# Patient Record
Sex: Male | Born: 1975 | Race: White | Hispanic: No | Marital: Married | Smoking: Never smoker
Health system: Southern US, Community
[De-identification: ages and names within clinical notes are randomized; demographics above are authoritative.]

## PROBLEM LIST (undated history)

## (undated) DIAGNOSIS — I10 Essential (primary) hypertension: Secondary | ICD-10-CM

## (undated) HISTORY — PX: TONSILLECTOMY: SUR1361

---

## 2005-07-11 ENCOUNTER — Ambulatory Visit: Payer: Self-pay | Admitting: *Deleted

## 2006-08-03 IMAGING — CR DG CHEST 2V
1 series · 2 of 2 positions shown · non-contrast
Comparison: none

REASON FOR EXAM: Wheezing
COMMENTS:

PROCEDURE:     DXR - DXR CHEST PA (OR AP) AND LATERAL  - July 11, 2005  [DATE]
RESULT:     Two views of the chest show the lungs are clear. The heart and
pulmonary vessels are normal. The bony and mediastinal structures are
unremarkable. There is no effusion or pneumothorax.

[Series 4001: postero_anterior · 0.22mm/px · 2 of 2 slices shown]
[im 1/2]
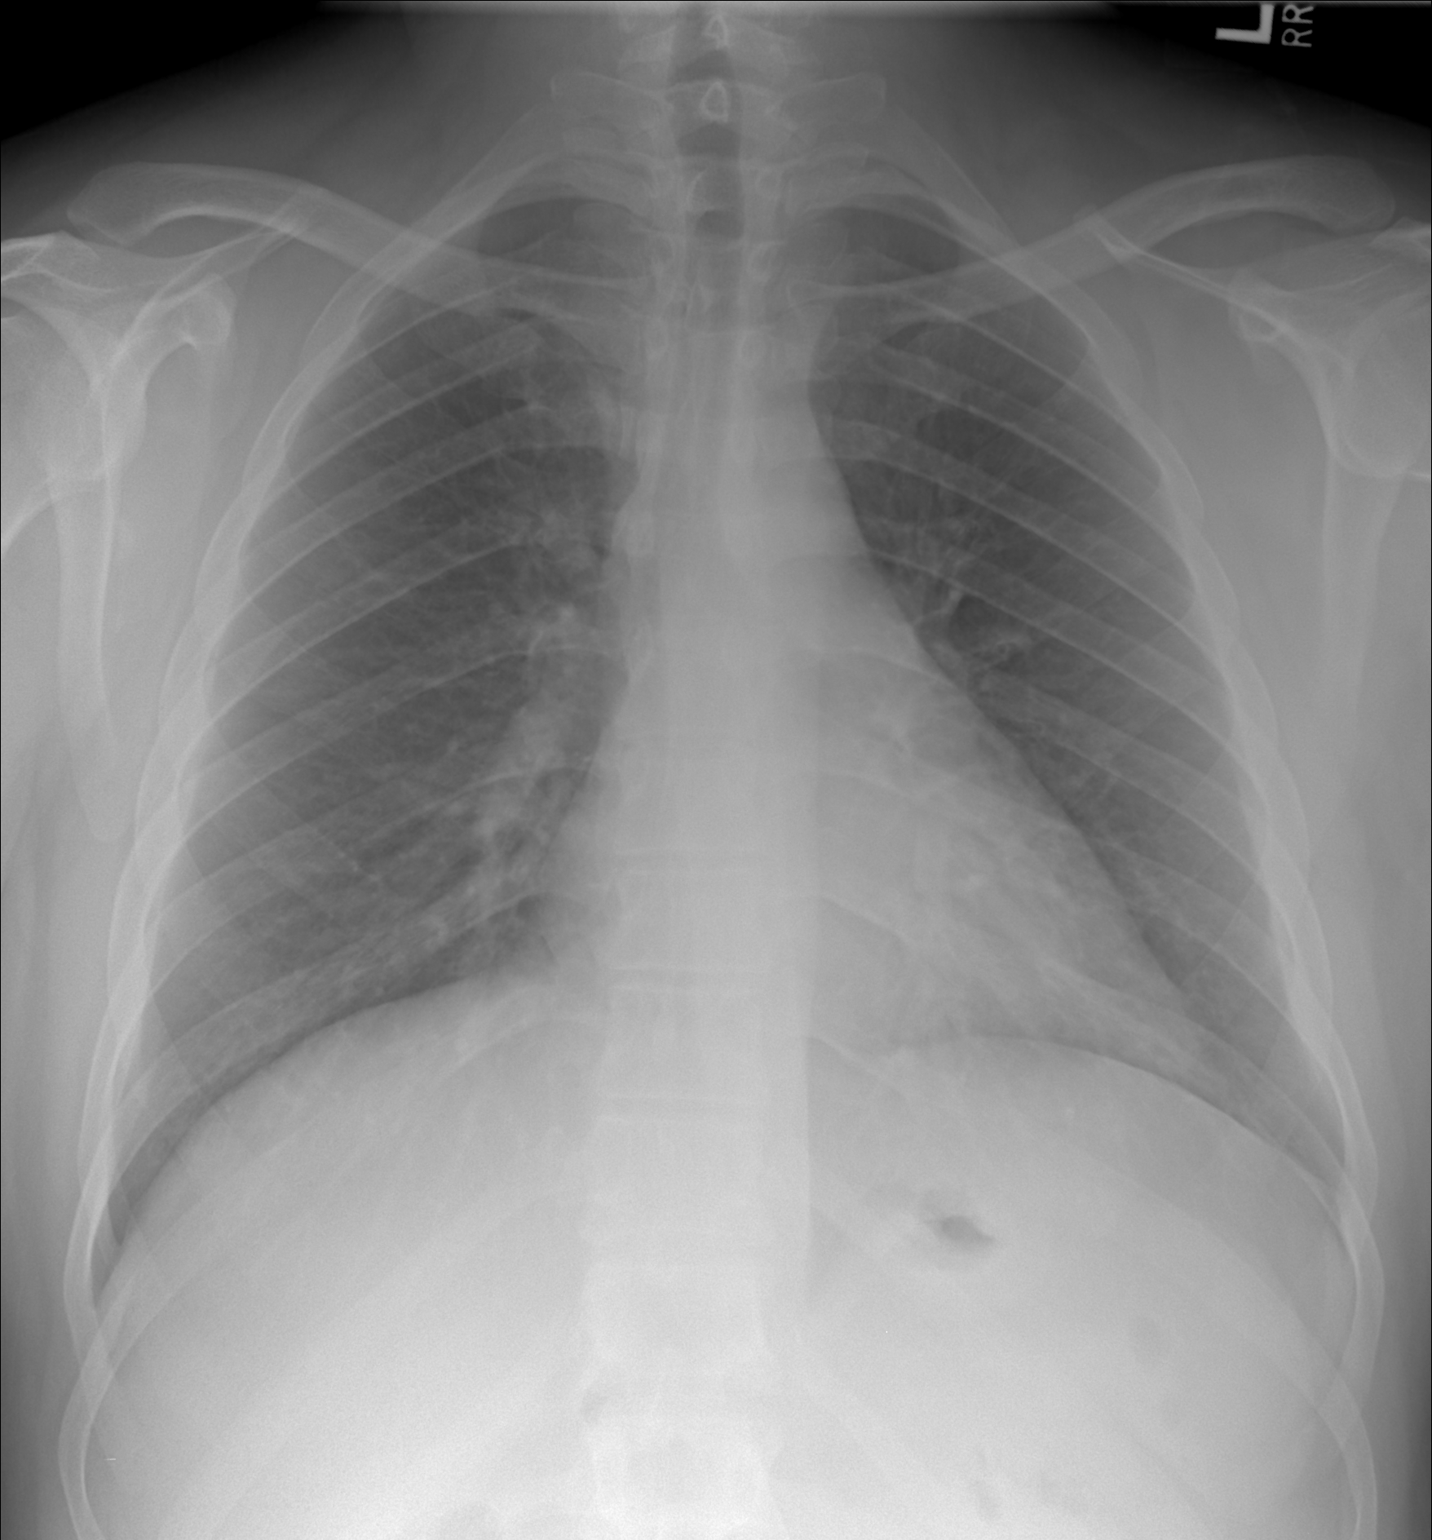
[im 2/2]
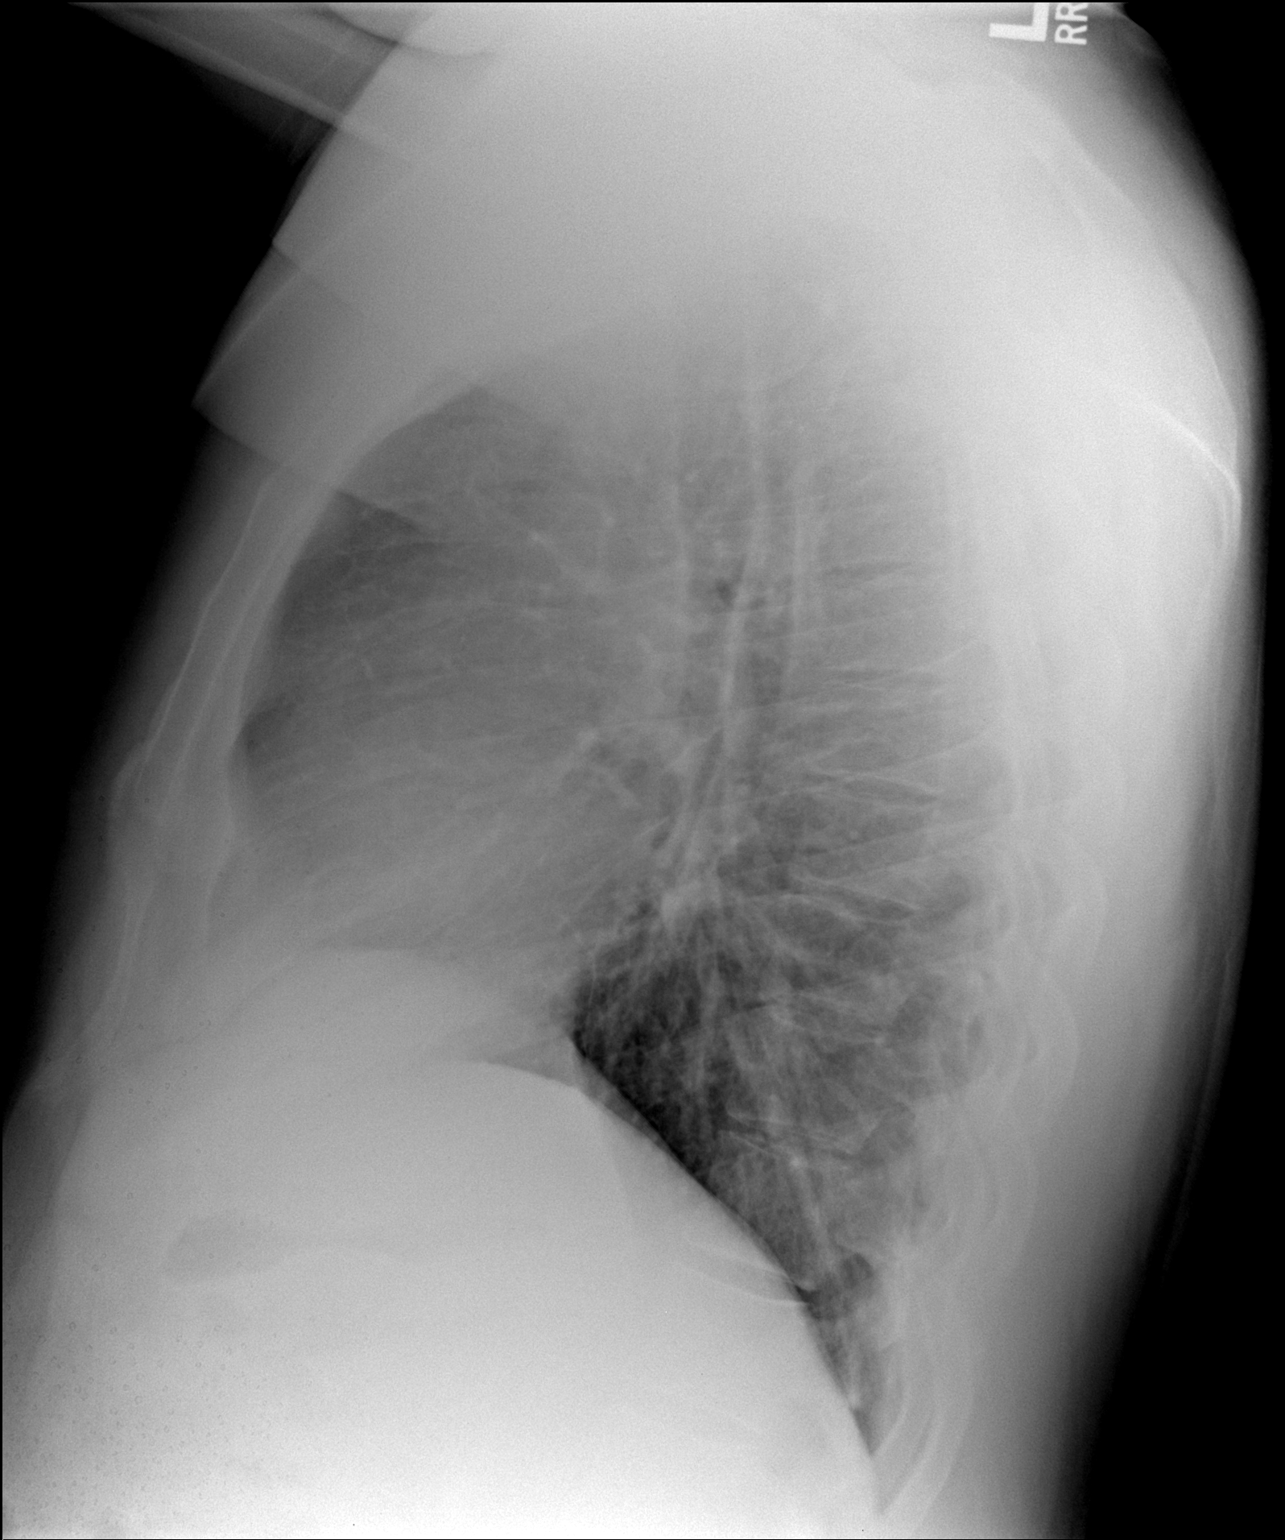

[2 of 2 positions shown; findings below may reference images not displayed]

IMPRESSION: No acute cardiopulmonary disease evident.

## 2013-12-24 ENCOUNTER — Ambulatory Visit: Payer: Self-pay | Admitting: Family Medicine

## 2017-03-15 ENCOUNTER — Ambulatory Visit
Admission: EM | Admit: 2017-03-15 | Discharge: 2017-03-15 | Disposition: A | Discharge status: Home / Self Care | Payer: 59 | Attending: Family Medicine | Admitting: Family Medicine

## 2017-03-15 ENCOUNTER — Encounter: Payer: Self-pay | Admitting: Gynecology

## 2017-03-15 DIAGNOSIS — B349 Viral infection, unspecified: Secondary | ICD-10-CM

## 2017-03-15 DIAGNOSIS — R509 Fever, unspecified: Secondary | ICD-10-CM

## 2017-03-15 DIAGNOSIS — M545 Low back pain, unspecified: Secondary | ICD-10-CM

## 2017-03-15 HISTORY — DX: Essential (primary) hypertension: I10

## 2017-03-15 MED ORDER — NAPROXEN 500 MG PO TABS: 500.0000 mg | ORAL_TABLET | Freq: Two times a day (BID) | ORAL | 0 refills | Status: AC

## 2017-03-15 MED ORDER — CYCLOBENZAPRINE HCL 10 MG PO TABS: 10.0000 mg | ORAL_TABLET | Freq: Two times a day (BID) | ORAL | 0 refills | Status: AC | PRN

## 2017-03-15 NOTE — ED Triage Notes (Signed)
Fever last night 103.1. No other symptoms

## 2017-03-15 NOTE — Discharge Instructions (Signed)
For Fever: -likely a viral illness, treatment is symptom management -can continue with Tylenol for fever -fluids -return to clinic if no improvement or symptoms worsen  For back pain -Naproxen: one tablet twice a day for pain. Do not take Advil while taking this medication. Will help with fever as well -Flexeril: one tablet twice a day as needed for pain. 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

## 2017-03-15 NOTE — ED Provider Notes (Signed)
[facility]    CSN: [id number] Arrival date & time: 03/15/17  1513     History   Chief Complaint Chief Complaint  Patient presents with  . Fever    HPI Evan Cochran is a 42 y.o. male.   Patient is a 41 year old male who presents with complaint of fever 3 days and lower back pain that started Monday. 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 Patient reports he is taking Tylenol for his fever. He reports temp of 103 last night. Reports his last Tylenol was about 7:30 this morning. Patient denies other symptoms, including cough, runny nose, ear issues, sore throat. He also denies any body aches except his lower back pain. He denies any sick contacts at work but does report a GI bug going around at work. He was initially taken Advil but a nurse advised his wife to try Tylenol instead which has been better for his fever.      Past Medical History:  Diagnosis Date  . Hypertension     There are no active problems to display for this patient.   History reviewed. No pertinent surgical history.     Home Medications    Prior to Admission medications   Medication Sig Start Date End Date Taking? Authorizing Provider  FLUoxetine (PROZAC) 10 MG tablet Take 10 mg by mouth daily.   Yes [provider]  lisinopril (PRINIVIL,ZESTRIL) 10 MG tablet Take 10 mg by mouth daily.   Yes [provider]  cyclobenzaprine (FLEXERIL) 10 MG tablet Take 1 tablet (10 mg total) by mouth 2 (two) times daily as needed for muscle spasms. 03/15/17   Candis Schatz, PA-C  naproxen (NAPROSYN) 500 MG tablet Take 1 tablet (500 mg total) by mouth 2 (two) times daily. 03/15/17   Candis Schatz, PA-C    Family History No family history on file.  Social History Social History  Substance Use Topics  . Smoking status: Never Smoker  . Smokeless tobacco: Never Used  . Alcohol use 0.6  oz/week    1 Cans of beer per week     Allergies   Patient has no known allergies.   Review of Systems Review of Systems  As noted above in history of present illness. Other systems reviewed and found to be negative.   Physical Exam Triage Vital Signs ED Triage Vitals  Enc Vitals Group     BP 03/15/17 1528 136/86     Pulse Rate 03/15/17 1528 96     Resp 03/15/17 1528 18     Temp 03/15/17 1528 99.2 F (37.3 C)     Temp Source 03/15/17 1528 Oral     SpO2 03/15/17 1528 97 %     Weight 03/15/17 1527 (!) 305 lb (138.3 kg)     Height 03/15/17 1527 6' (1.829 m)     Head Circumference --      Peak Flow --      Pain Score --      Pain Loc --      Pain Edu? --      Excl. in GC? --    No data found.   Updated Vital Signs BP 136/86 (BP Location: Left Arm)   Pulse 96   Temp 99.2 F (37.3 C) (Oral)   Resp 18   Ht 6' (1.829 m)   Wt (!) 305 lb (138.3 kg)   SpO2 97%  BMI 41.37 kg/m   Physical Exam  Constitutional: He is oriented to person, place, and time. He appears well-developed and well-nourished.  HENT:  Head: Normocephalic and atraumatic.  Eyes: Pupils are equal, round, and reactive to light. EOM are normal.  Neck: Normal range of motion. Neck supple.  Cardiovascular: Normal rate, regular rhythm and normal heart sounds.   No murmur heard. Pulmonary/Chest: Effort normal and breath sounds normal. No respiratory distress. He has no wheezes. He has no rales.  Abdominal: Soft.  Musculoskeletal: Normal range of motion. He exhibits tenderness (mild tenderness with palpation.).  No decreased range of motion with the back discomfort.  Neurological: He is alert and oriented to person, place, and time. No cranial nerve deficit.     UC Treatments / Results  Labs (all labs ordered are listed, but only abnormal results are displayed) Labs Reviewed - No data to display  EKG  EKG Interpretation None       Radiology No results found.  Procedures Procedures  (including critical care time)  Medications Ordered in UC Medications - No data to display   Initial Impression / Assessment and Plan / UC Course  I have reviewed the triage vital signs and the nursing notes.  Pertinent labs & imaging results that were available during my care of the patient were reviewed by me and considered in my medical decision making (see chart for details).    Patient currently with fever but no other symptoms, tachycardia, or other signs of infection. Low back pain.  Final Clinical Impressions(s) / UC Diagnoses   Final diagnoses:  Fever, unspecified  Bilateral low back pain without sciatica, unspecified chronicity  Viral illness   Likely viral illness. This has been on for several days. Will recommend continue Tylenol. Recommend return to clinic if no improvement. Also with low back pain with some mild tenderness to palpation. Give prescription for Flexeril as well as naproxen. Advised naproxen should also help with his fever but advised not to take ibuprofen while taking the naproxen. Patient verbalized understanding agreement with plan.  New Prescriptions Discharge Medication List as of 03/15/2017  3:59 PM    START taking these medications   Details  cyclobenzaprine (FLEXERIL) 10 MG tablet Take 1 tablet (10 mg total) by mouth 2 (two) times daily as needed for muscle spasms., Starting Sat 03/15/2017, Normal    naproxen (NAPROSYN) 500 MG tablet Take 1 tablet (500 mg total) by mouth 2 (two) times daily., Starting Sat 03/15/2017, Normal         Controlled Substance Prescriptions [place] Controlled Substance Registry consulted? Not Applicable   Candis SchatzHarris, Locke Barrell D, PA-C 03/15/17 1657

## 2017-08-25 ENCOUNTER — Other Ambulatory Visit: Payer: Self-pay

## 2017-08-25 ENCOUNTER — Ambulatory Visit
Admission: EM | Admit: 2017-08-25 | Discharge: 2017-08-25 | Disposition: A | Discharge status: Home / Self Care | Payer: 59 | Attending: Family Medicine | Admitting: Family Medicine

## 2017-08-25 DIAGNOSIS — L509 Urticaria, unspecified: Secondary | ICD-10-CM

## 2017-08-25 NOTE — ED Provider Notes (Signed)
[facility]    CSN: [id number] Arrival date & time: 08/25/17  1651     History   Chief Complaint Chief Complaint  Patient presents with  . Urticaria    HPI Evan Cochran is a 42 y.o. male.   42 yo male with a c/o 3 days of hives/rash on abdomen and extremities. States started spontaneously. Denies any itching, shortness of breath, facial swelling, wheezing, chest pain, trouble swallowing. States "I feel fine" except for the rash. Has taken benadryl with temporary improvement.   The history is provided by the patient.  Urticaria     Past Medical History:  Diagnosis Date  . Hypertension     There are no active problems to display for this patient.   Past Surgical History:  Procedure Laterality Date  . TONSILLECTOMY         Home Medications    Prior to Admission medications   Medication Sig Start Date End Date Taking? Authorizing Provider  FLUoxetine (PROZAC) 10 MG tablet Take 10 mg by mouth daily.   Yes [provider]  lisinopril (PRINIVIL,ZESTRIL) 10 MG tablet Take 10 mg by mouth daily.   Yes [provider]  cyclobenzaprine (FLEXERIL) 10 MG tablet Take 1 tablet (10 mg total) by mouth 2 (two) times daily as needed for muscle spasms. 03/15/17   Candis SchatzHarris, Michael D, PA-C  naproxen (NAPROSYN) 500 MG tablet Take 1 tablet (500 mg total) by mouth 2 (two) times daily. 03/15/17   Candis SchatzHarris, Michael D, PA-C    Family History Family History  Problem Relation Age of Onset  . Cancer Mother   . Hypertension Mother   . Hypertension Father     Social History Social History   Tobacco Use  . Smoking status: Never Smoker  . Smokeless tobacco: Current User    Types: Chew  Substance Use Topics  . Alcohol use: Yes    Alcohol/week: 0.6 oz    Types: 1 Cans of beer per week    Comment: occasionally  . Drug use: No     Allergies   Patient has no known allergies.   Review of Systems Review of Systems   Physical Exam Triage Vital  Signs ED Triage Vitals  Enc Vitals Group     BP 08/25/17 1758 (!) 139/98     Pulse Rate 08/25/17 1758 (!) 111     Resp 08/25/17 1758 18     Temp 08/25/17 1758 99.1 F (37.3 C)     Temp Source 08/25/17 1758 Oral     SpO2 08/25/17 1758 98 %     Weight 08/25/17 1756 300 lb (136.1 kg)     Height 08/25/17 1756 6' (1.829 m)     Head Circumference --      Peak Flow --      Pain Score 08/25/17 1755 1     Pain Loc --      Pain Edu? --      Excl. in GC? --    No data found.  Updated Vital Signs BP (!) 139/98 (BP Location: Left Arm)   Pulse (!) 111   Temp 99.1 F (37.3 C) (Oral)   Resp 18   Ht 6' (1.829 m)   Wt 300 lb (136.1 kg)   SpO2 98%   BMI 40.69 kg/m   Visual Acuity Right Eye Distance:   Left Eye Distance:   Bilateral Distance:    Right Eye Near:   Left Eye Near:    Bilateral Near:  Physical Exam  Constitutional: He appears well-developed and well-nourished. No distress.  HENT:  Head: Normocephalic and atraumatic.  Right Ear: Tympanic membrane, external ear and ear canal normal.  Left Ear: Tympanic membrane, external ear and ear canal normal.  Nose: Nose normal.  Mouth/Throat: Uvula is midline, oropharynx is clear and moist and mucous membranes are normal. No oropharyngeal exudate, posterior oropharyngeal edema, posterior oropharyngeal erythema or tonsillar abscesses. No tonsillar exudate.  Eyes: Conjunctivae are normal. Right eye exhibits no discharge. Left eye exhibits no discharge. No scleral icterus.  Neck: Normal range of motion. Neck supple. No tracheal deviation present. No thyromegaly present.  Cardiovascular: Normal rate, regular rhythm and normal heart sounds.  Pulmonary/Chest: Effort normal and breath sounds normal. No stridor. No respiratory distress. He has no wheezes. He has no rales. He exhibits no tenderness.  Lymphadenopathy:    He has no cervical adenopathy.  Neurological: He is alert.  Skin: Skin is warm and dry. Rash noted. Rash is  urticarial. He is not diaphoretic.  Nursing note and vitals reviewed.    UC Treatments / Results  Labs (all labs ordered are listed, but only abnormal results are displayed) Labs Reviewed - No data to display  EKG None Radiology No results found.  Procedures Procedures (including critical care time)  Medications Ordered in UC Medications - No data to display   Initial Impression / Assessment and Plan / UC Course  I have reviewed the triage vital signs and the nursing notes.  Pertinent labs & imaging results that were available during my care of the patient were reviewed by me and considered in my medical decision making (see chart for details).       Final Clinical Impressions(s) / UC Diagnoses   Final diagnoses:  Hives    ED Discharge Orders    None     1.diagnosis reviewed with patient 2. Recommend supportive treatment with otc antihistamines and H2 blockers 3. Follow-up prn if symptoms worsen or don't improve  Controlled Substance Prescriptions [place] Controlled Substance Registry consulted? Not Applicable   Payton Mccallum, MD 08/25/17 [id number]

## 2017-08-25 NOTE — Discharge Instructions (Signed)
Zyrtec, benadryl, zantac °

## 2017-08-25 NOTE — ED Triage Notes (Signed)
Patient complains of hives that started on Saturday and have since spread to other parts of his body today. Originated on his abdomen. Reports mild burning sensation.
